# Patient Record
Sex: Female | Born: 1980 | Race: White | Hispanic: No | Marital: Married | Smoking: Never smoker
Health system: Southern US, Community
[De-identification: ages and names within clinical notes are randomized; demographics above are authoritative.]

---

## 2005-01-31 ENCOUNTER — Other Ambulatory Visit: Admission: RE | Admit: 2005-01-31 | Discharge: 2005-01-31 | Payer: Self-pay | Admitting: Obstetrics and Gynecology

## 2005-08-06 ENCOUNTER — Other Ambulatory Visit: Admission: RE | Admit: 2005-08-06 | Discharge: 2005-08-06 | Payer: Self-pay | Admitting: Obstetrics and Gynecology

## 2006-01-31 ENCOUNTER — Emergency Department (HOSPITAL_COMMUNITY): Admission: EM | Admit: 2006-01-31 | Discharge: 2006-01-31 | Payer: Self-pay | Admitting: Family Medicine

## 2007-03-28 ENCOUNTER — Inpatient Hospital Stay (HOSPITAL_COMMUNITY): Admission: AD | Admit: 2007-03-28 | Discharge: 2007-03-28 | Payer: Self-pay | Admitting: Obstetrics and Gynecology

## 2007-06-13 ENCOUNTER — Inpatient Hospital Stay (HOSPITAL_COMMUNITY): Admission: AD | Admit: 2007-06-13 | Discharge: 2007-06-13 | Payer: Self-pay | Admitting: Obstetrics and Gynecology

## 2007-06-13 ENCOUNTER — Ambulatory Visit: Payer: Self-pay | Admitting: Gynecology

## 2007-06-14 ENCOUNTER — Inpatient Hospital Stay (HOSPITAL_COMMUNITY): Admission: AD | Admit: 2007-06-14 | Discharge: 2007-06-15 | Payer: Self-pay | Admitting: Obstetrics and Gynecology

## 2007-06-19 ENCOUNTER — Ambulatory Visit (HOSPITAL_COMMUNITY): Admission: RE | Admit: 2007-06-19 | Discharge: 2007-06-19 | Payer: Self-pay | Admitting: Obstetrics and Gynecology

## 2007-06-25 ENCOUNTER — Ambulatory Visit: Payer: Self-pay | Admitting: Gynecology

## 2007-07-02 ENCOUNTER — Ambulatory Visit: Payer: Self-pay | Admitting: Obstetrics and Gynecology

## 2007-07-04 ENCOUNTER — Inpatient Hospital Stay (HOSPITAL_COMMUNITY): Admission: AD | Admit: 2007-07-04 | Discharge: 2007-07-07 | Payer: Self-pay | Admitting: Obstetrics and Gynecology

## 2007-07-04 ENCOUNTER — Encounter (INDEPENDENT_AMBULATORY_CARE_PROVIDER_SITE_OTHER): Payer: Self-pay | Admitting: Obstetrics and Gynecology

## 2007-07-08 ENCOUNTER — Encounter: Admission: RE | Admit: 2007-07-08 | Discharge: 2007-08-07 | Payer: Self-pay | Admitting: Obstetrics and Gynecology

## 2007-08-08 ENCOUNTER — Encounter: Admission: RE | Admit: 2007-08-08 | Discharge: 2007-09-06 | Payer: Self-pay | Admitting: Obstetrics and Gynecology

## 2007-09-07 ENCOUNTER — Encounter: Admission: RE | Admit: 2007-09-07 | Discharge: 2007-10-07 | Payer: Self-pay | Admitting: Obstetrics and Gynecology

## 2007-09-29 IMAGING — CR DG FOOT COMPLETE 3+V*R*
3 series · 3 of 3 positions shown · non-contrast
Comparison: None.

CLINICAL DATA: Twisted foot - lateral pain.
 RIGHT FOOT -  VIEW:

[view not recorded (1 of 3)]
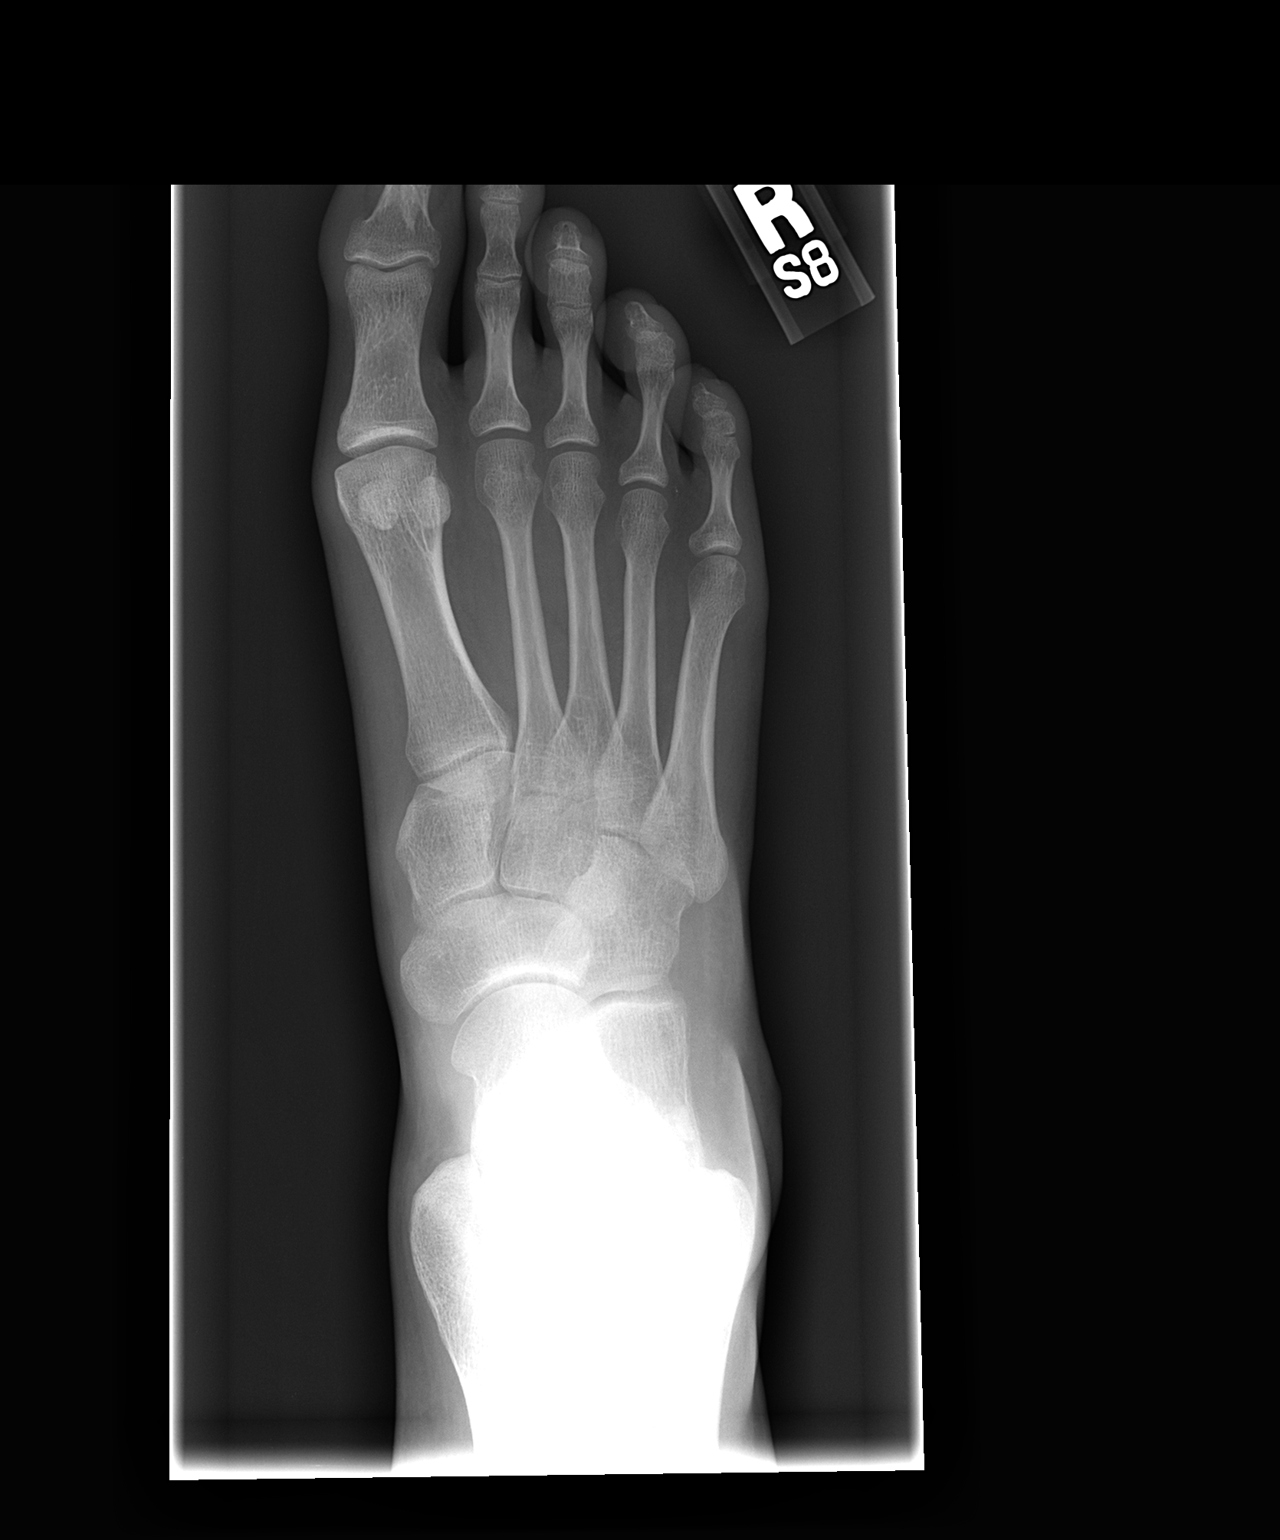

[view not recorded (2 of 3)]
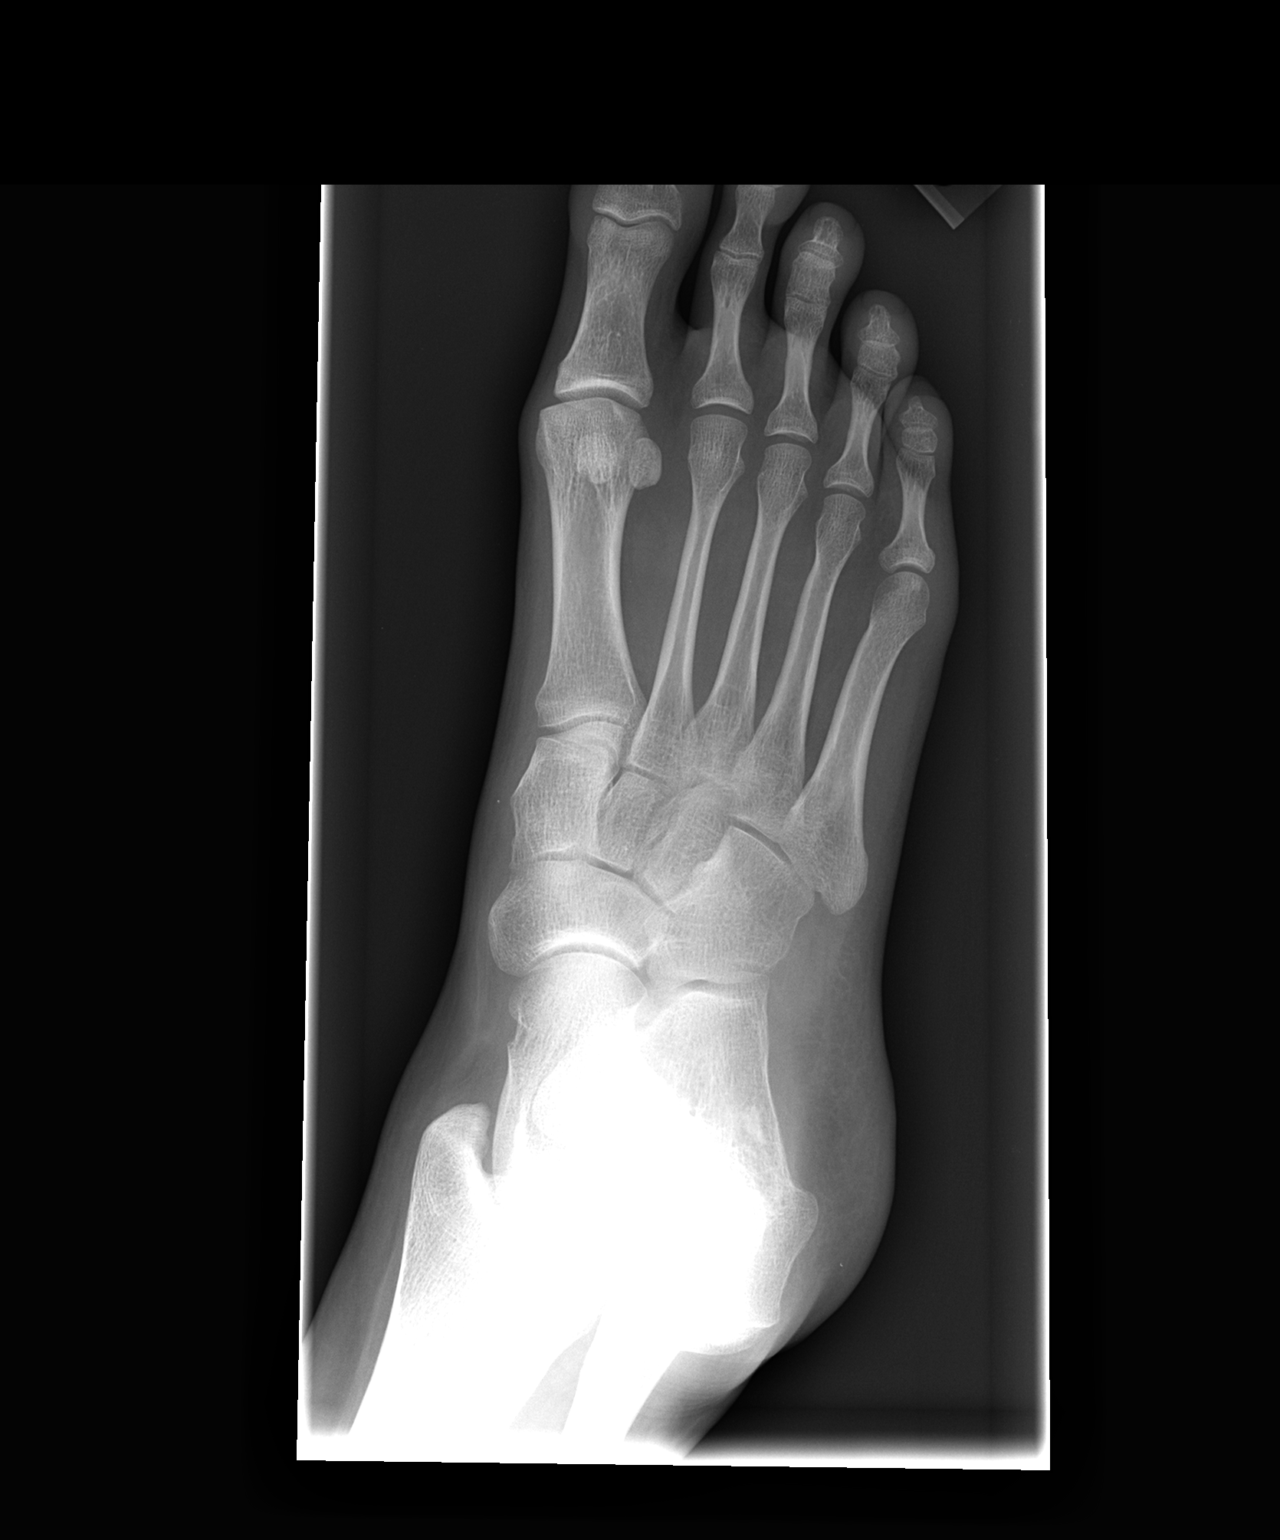

[view not recorded (3 of 3)]
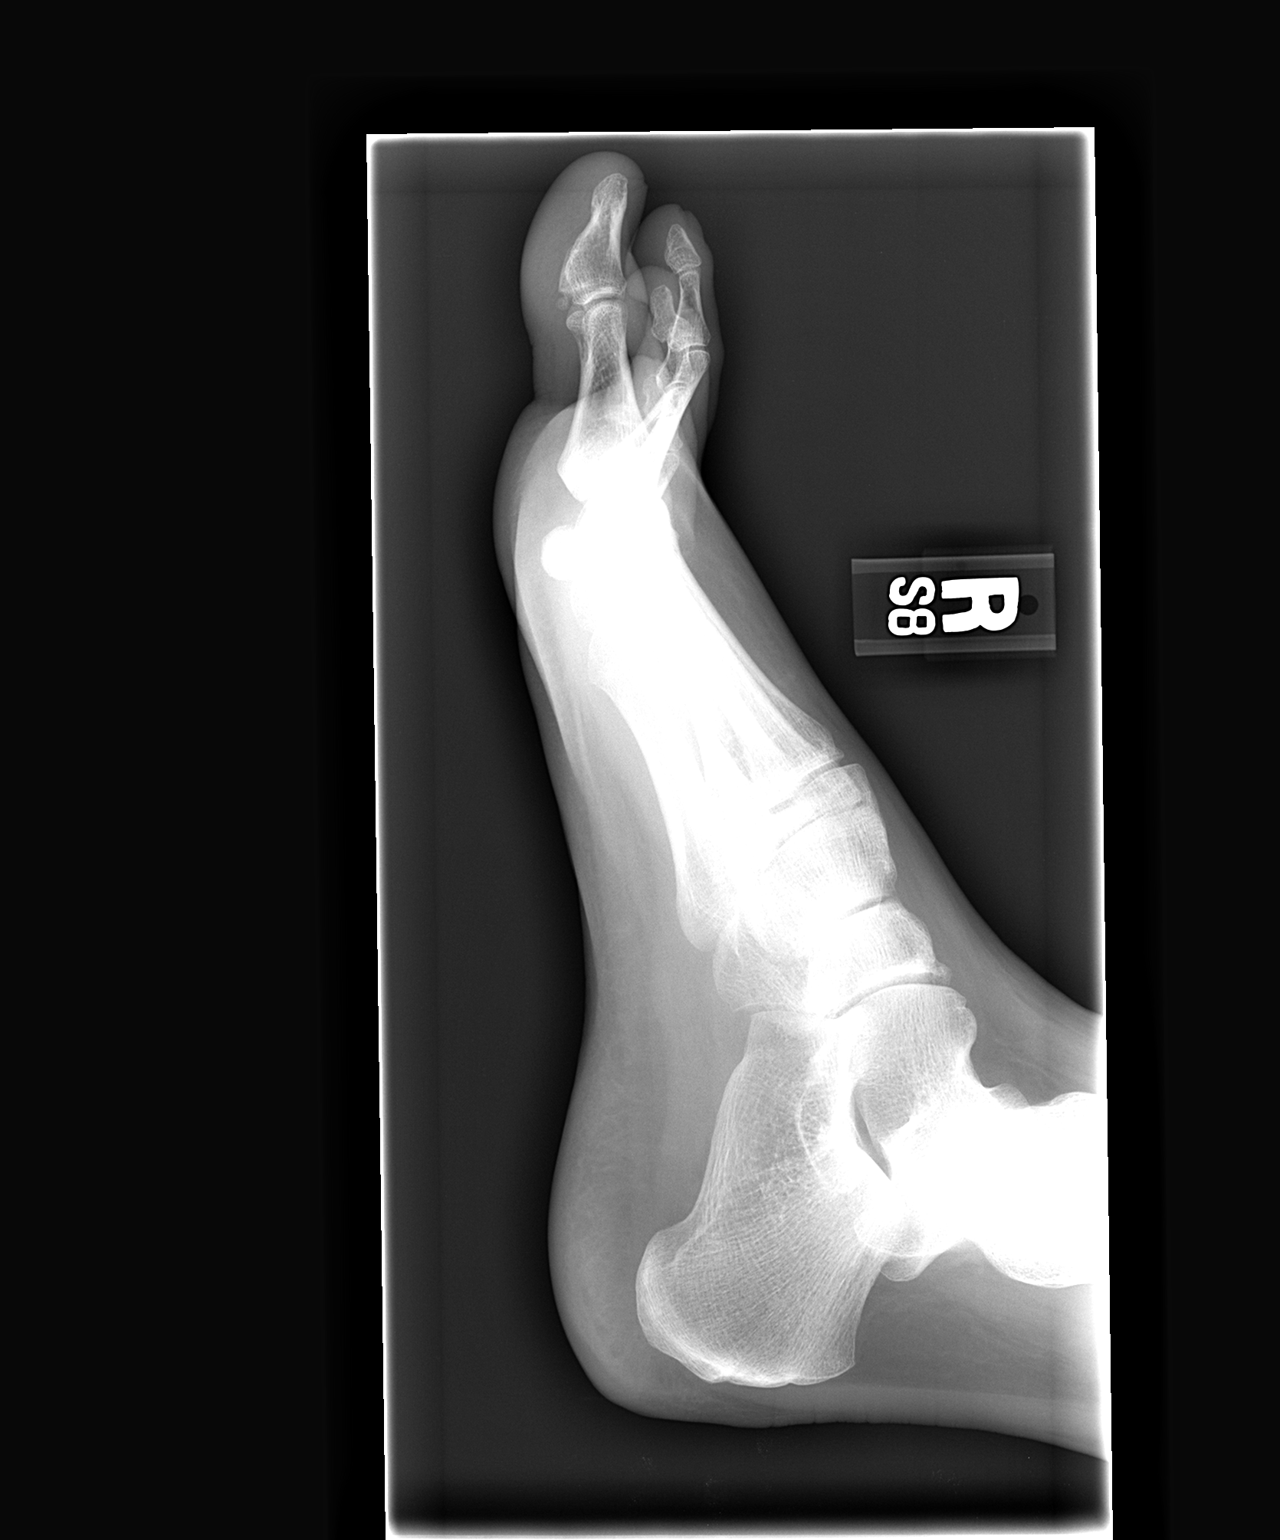

[3 of 3 positions shown; findings below may reference images not displayed]

There is no evidence of fracture or dislocation.  There is no evidence of arthropathy or other focal bone abnormality.  Soft tissues are unremarkable.
IMPRESSION: Negative.

## 2007-10-08 ENCOUNTER — Encounter: Admission: RE | Admit: 2007-10-08 | Discharge: 2007-11-06 | Payer: Self-pay | Admitting: Obstetrics and Gynecology

## 2007-11-07 ENCOUNTER — Encounter: Admission: RE | Admit: 2007-11-07 | Discharge: 2007-12-07 | Payer: Self-pay | Admitting: Obstetrics and Gynecology

## 2007-12-08 ENCOUNTER — Encounter: Admission: RE | Admit: 2007-12-08 | Discharge: 2008-01-07 | Payer: Self-pay | Admitting: Obstetrics and Gynecology

## 2008-01-08 ENCOUNTER — Encounter: Admission: RE | Admit: 2008-01-08 | Discharge: 2008-02-04 | Payer: Self-pay | Admitting: Obstetrics and Gynecology

## 2008-02-05 ENCOUNTER — Encounter: Admission: RE | Admit: 2008-02-05 | Discharge: 2008-03-06 | Payer: Self-pay | Admitting: Obstetrics and Gynecology

## 2008-03-07 ENCOUNTER — Encounter: Admission: RE | Admit: 2008-03-07 | Discharge: 2008-03-25 | Payer: Self-pay | Admitting: Obstetrics and Gynecology

## 2009-02-08 IMAGING — US US FETAL BPP W/O NONSTRESS
1 series · 14 of 28 positions shown · non-contrast
Comparison: none

OBSTETRICAL ULTRASOUND:

 This ultrasound exam was performed in the [HOSPITAL] Ultrasound Department.  The OB US report was generated in the AS system, and faxed to the ordering physician.  This report is also available in [REDACTED] PACS.

[Series 1: us fetal bpp w/o nonstress · 14 of 38 slices shown]
[im 2/38]
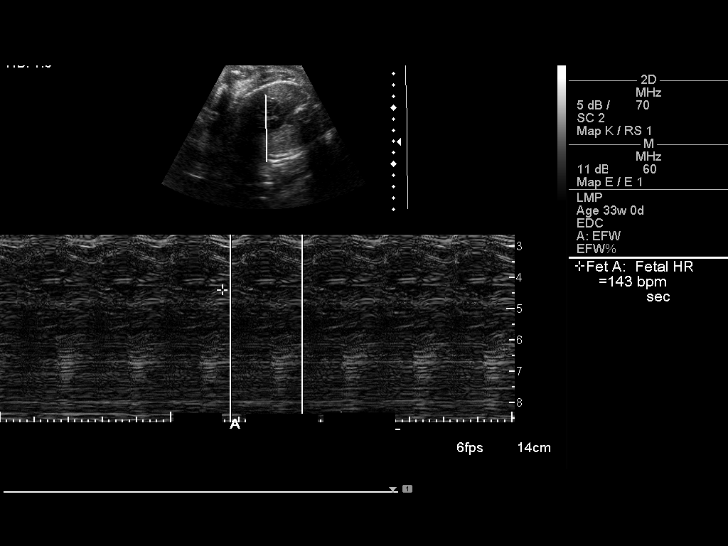
[im 5/38]
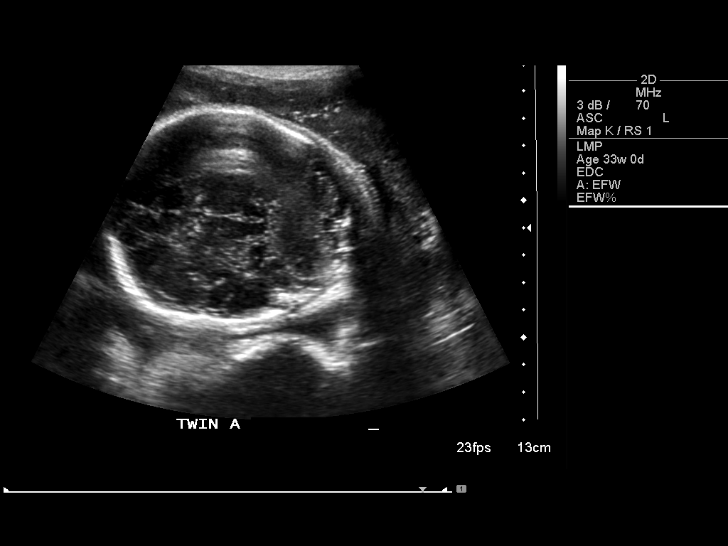
[im 7/38]
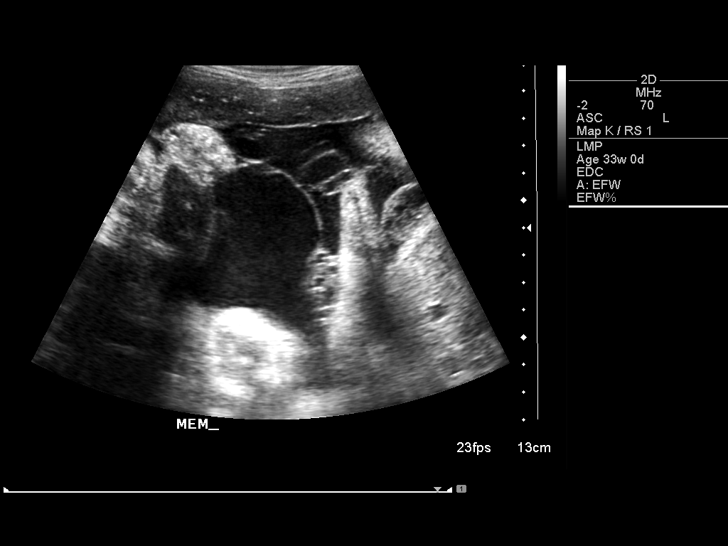
[im 10/38]
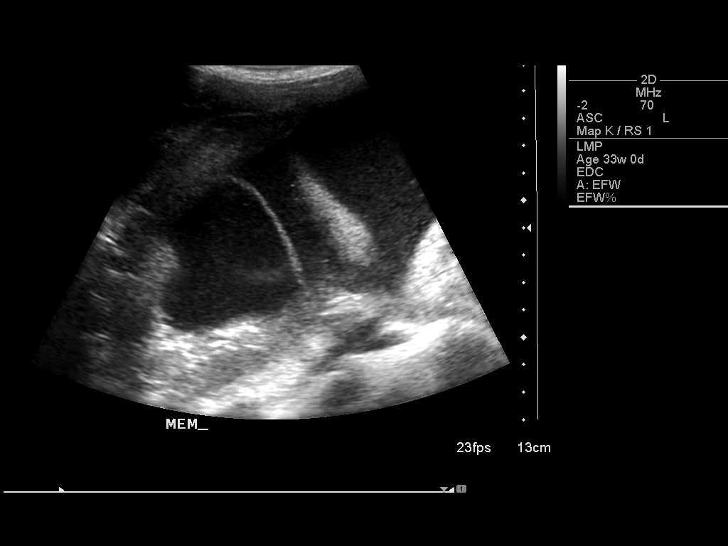
[im 13/38]
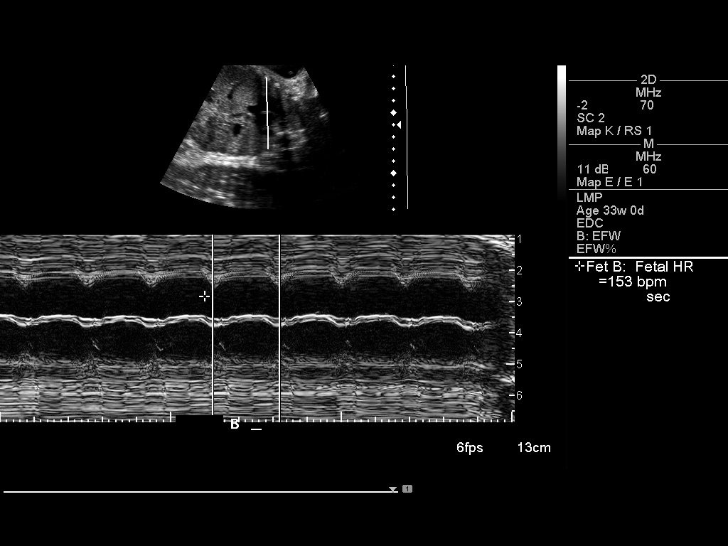
[im 16/38]
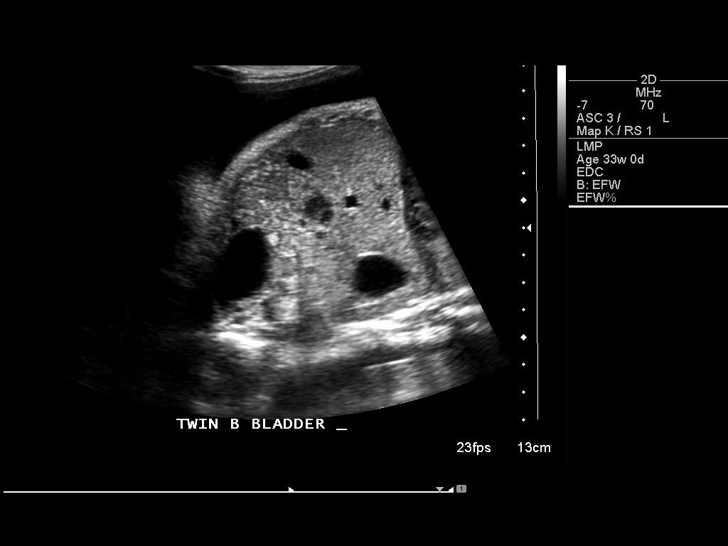
[im 18/38]
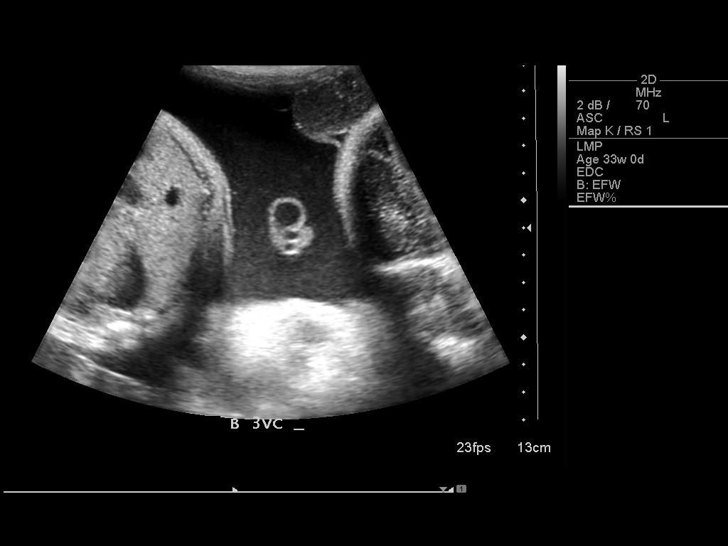
[im 21/38]
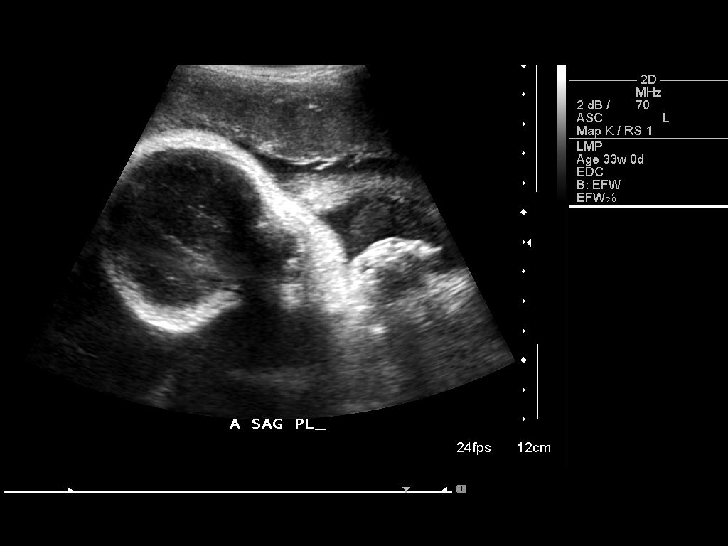
[im 24/38]
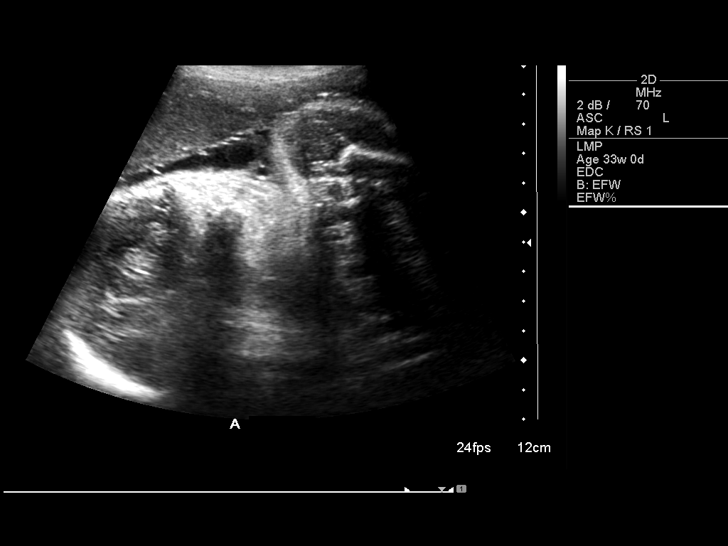
[im 27/38]
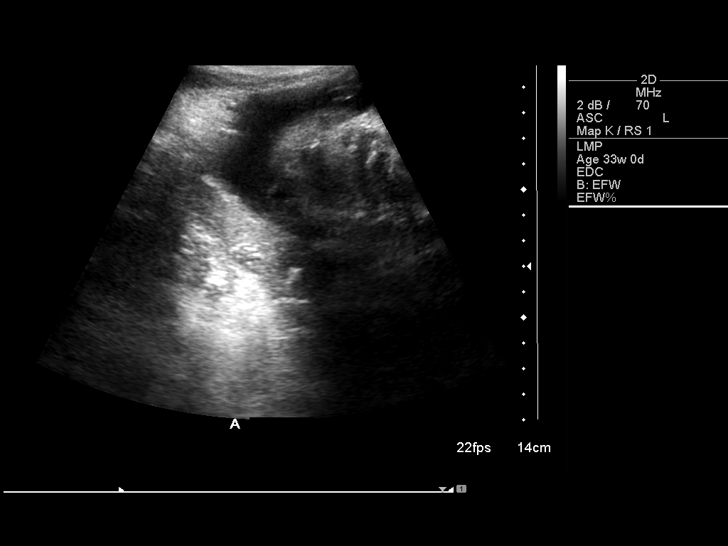
[im 29/38]
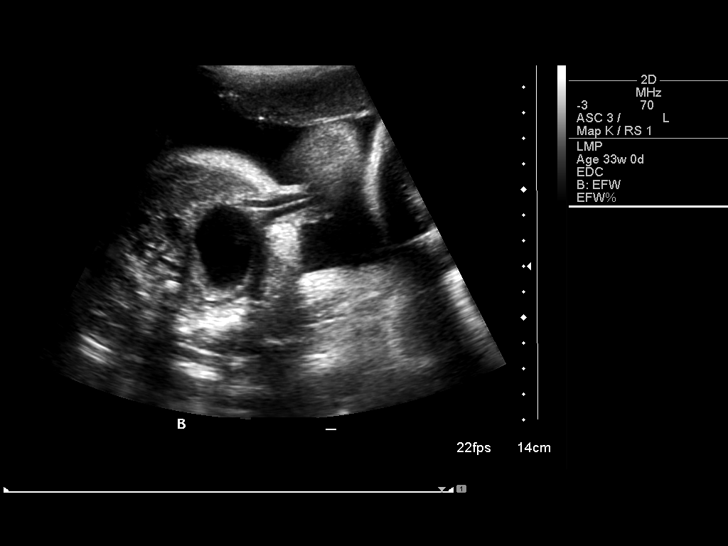
[im 32/38]
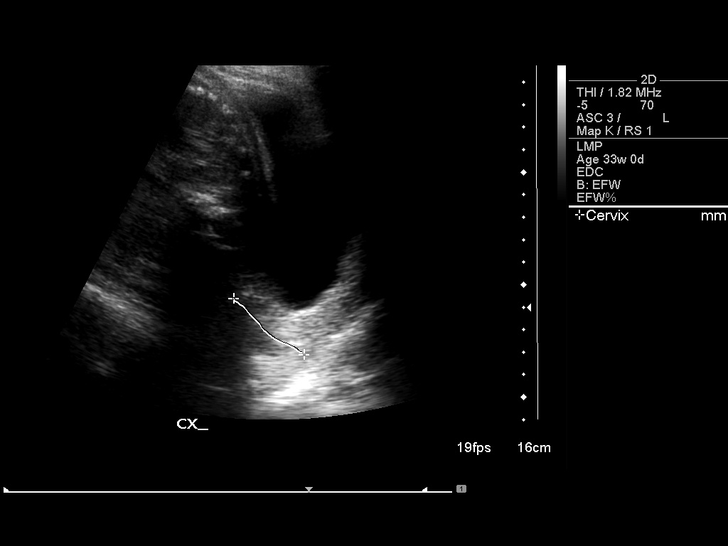
[im 35/38]
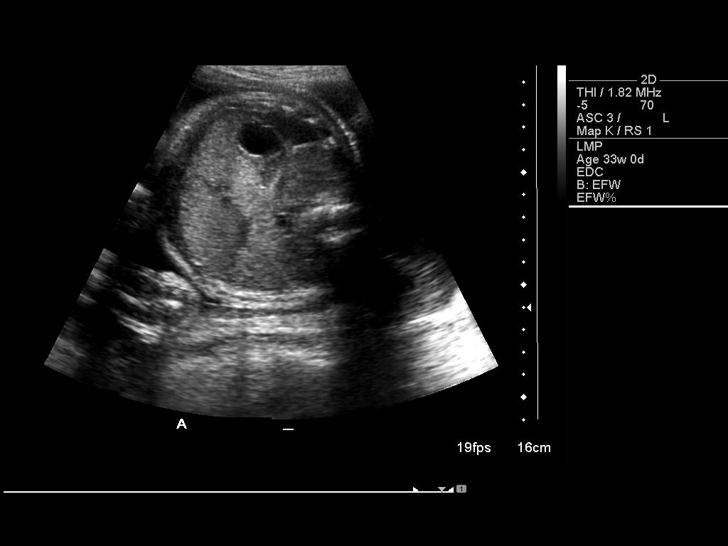
[im 38/38]
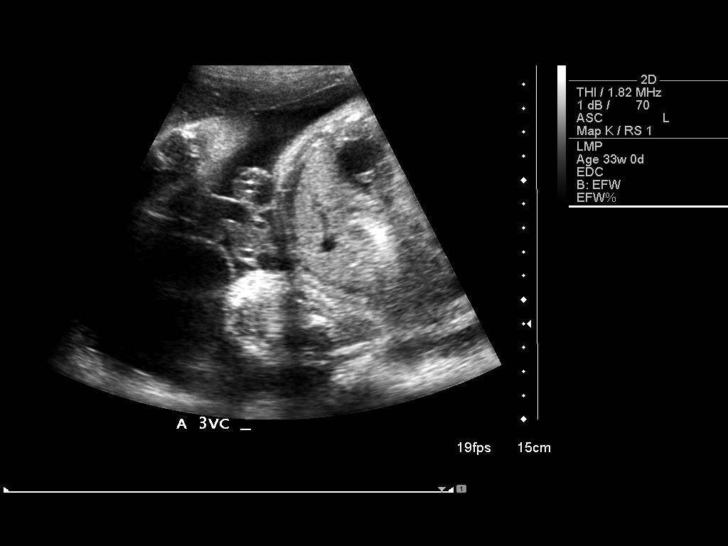

[14 of 28 positions shown; findings below may reference images not displayed]

IMPRESSION: See AS Obstetric US report.

## 2011-04-10 NOTE — H&P (Signed)
NAME:  Penny Larson, Penny Larson                ACCOUNT NO.:  [id number]   MEDICAL RECORD NO.:  [id number]          PATIENT TYPE:  INP   LOCATION:                                FACILITY:  [facility]   PHYSICIAN:  Dineen Kid. Rana Snare, M.D.    DATE OF BIRTH:  [birth year]   DATE OF ADMISSION:  DATE OF DISCHARGE:                              HISTORY & PHYSICAL   HISTORY OF PRESENT ILLNESS:  Ms. Sprankle is a 30 year old G1 P0 at [redacted] weeks  gestational age with twin pregnancy, breech transverse presentation  based on recent ultrasound.  Pregnancy has been complicated by pre-term  labor currently on bed rest and having recently discontinued terbutaline  she is now having regular contractions.  Cervix is 2 cm.  She presents  for primary cesarean section.  Her estimated date of confinement is  08/01/2007.  Her pregnancy was complicated only by elevated one hour  Glucola but normal 3 hour Glucola and pre-term labor.   PAST MEDICAL HISTORY:  Negative.   PAST SURGICAL HISTORY:  Negative.   MEDICATIONS:  Prenatal vitamins and terbutaline.   ALLERGIES:  No known drug allergies.   PHYSICAL EXAMINATION:  Her blood pressure is 104/50.  HEART:  Regular rate and rhythm.  LUNGS:  Clear to auscultation bilaterally.  ABDOMEN:  Gravid with twins.  Cervix is 2, 50% minus 3.  Presentation is  breech transverse.   IMPRESSION:  Twins at 36 weeks with labor, advanced dilation, breech  transverse presentation.  Plan primary low segment transverse cesarean  section.  Risks and benefits were discussed at length, informed consent  was obtained.      Dineen Kid Rana Snare, M.D.  Electronically Signed     DCL/MEDQ  D:  07/04/2007  T:  07/04/2007  Job:  [id number]

## 2011-04-10 NOTE — Discharge Summary (Signed)
NAME:  Penny Larson, Penny Larson                ACCOUNT NO.:  [id number]   MEDICAL RECORD NO.:  [id number]          PATIENT TYPE:  INP   LOCATION:  9124                          FACILITY:  [facility]   PHYSICIAN:  Guy Sandifer. Henderson Cloud, M.D. DATE OF BIRTH:  01-27-1981   DATE OF ADMISSION:  07/04/2007  DATE OF DISCHARGE:  07/07/2007                               DISCHARGE SUMMARY   ADMITTING DIAGNOSES:  1. Twin intrauterine gestation.  2. Thirty six weeks estimated gestational age.  3. Preterm labor.  4. Breech transverse presentation.   DISCHARGE DIAGNOSES:  1. Twin intrauterine gestation.  2. Thirty six weeks estimated gestational age.  3. Preterm labor.  4. Breech transverse presentation.   PROCEDURE:  On July 04, 2007, primary low-transverse cesarean section.   REASON FOR ADMISSION:  This patient is a 30 year old married female, G1,  P0, with an EDC of August 01, 2007, with a twin intrauterine  gestation.  She presents in preterm labor with breech transverse  presentation.   HOSPITAL COURSE:  The patient was admitted to the hospital, underwent  the above procedure.  Postoperatively, hemoglobin is 8.4.  Vital signs  remained stable.  She is afebrile.  She has good resumption of bowel and  bladder function.  She is ambulating well with good pain relief and  tolerating a regular diet.   CONDITION ON DISCHARGE:  Good.   DIET:  Regular as tolerated.   ACTIVITY:  No lifting, no operation of automobiles, no vaginal entry.   She is to call the office for problems including, but not limited to,  temperature of 101 degrees, increasing pain, persistent nausea,  vomiting, or heavy bleeding.   FOLLOWUP:  In the office in 2 weeks.   MEDICATIONS:  1. Percocet 5/325 mg, #40, one to two p.o. q.6 h. p.r.n.  2. Ibuprofen 600 mg q.6 h. p.r.n.  3. Tandem iron supplement, #30, one p.o. daily, one refill.  4. Prenatal vitamin daily.      Guy Sandifer Henderson Cloud, M.D.  Electronically Signed     JET/MEDQ   D:  07/07/2007  T:  07/07/2007  Job:  [id number]

## 2011-04-10 NOTE — Op Note (Signed)
NAME:  Penny Larson, Penny Larson                ACCOUNT NO.:  [id number]   MEDICAL RECORD NO.:  [id number]           PATIENT TYPE:   LOCATION:                                FACILITY:  [facility]   PHYSICIAN:  Dineen Kid. Rana Snare, M.D.    DATE OF BIRTH:  04/15/1981   DATE OF PROCEDURE:  07/04/2007  DATE OF DISCHARGE:                               OPERATIVE REPORT   PREOPERATIVE DIAGNOSIS:  Intrauterine pregnancy at 36 weeks, preterm  labor, and breech transverse presentation.   POSTOPERATIVE DIAGNOSIS:  Intrauterine pregnancy at 36 weeks, preterm  labor, and breech transverse presentation.   PROCEDURE:  Primary low segment transverse cesarean section.   SURGEON:  Dineen Kid. Rana Snare, M.D.   ANESTHESIA:  Spinal.   INDICATIONS:  Ms. Morrish is a 30 year old G1 at 39 weeks, previously  having problems with preterm labor on bedrest now presents in labor  after discontinuing her Terbutaline.  The baby is in a breech transverse  presentation.  Planned primary low segment transverse cesarean section.  The risks and benefits were discussed.  Informed consent was obtained.   FINDINGS AT SURGERY:  Viable females. Baby A with Apgars were 4 and 8,  pH arterial 7.30.  Baby B with Apgars 8 and 8 with a pH arterial 7.3.  Baby B weight is 5 pounds 10 ounces, baby A weight is currently pending.  There were no complications.   DESCRIPTION OF PROCEDURE:  After adequate analgesia, the patient was  placed in the supine position with left lateral tilt.  She is sterilely  prepped and draped.  The bladder is sterilely drained with a Foley  catheter.  A Pfannenstiel skin incision was made two fingerbreadths  above the pubic symphysis and taken down sharply to the fascia which was  incised transversely and extended superiorly and inferiorly over the  bellies of the rectus muscle which were separated sharply in the  midline.  The peritoneum was entered sharply.  The bladder flap was  created and placed behind the bladder blade.  A low  segment myotomy  incision was made down to the amniotic sac. Baby A buttocks were easily  delivered, the arms reduced.  The nares and pharynx were suctioned.  The  cord was clamped and cut and the baby was handed to the pediatrician for  resuscitation.  Cord blood was obtained. Baby B was manually everted  from transverse to vertex and delivered with clear fluid noted.  The  nares and pharynx were suctioned, the baby was then delivered, cord  clamped, cut and handed to the pediatrician for resuscitation.  Cord  blood was obtained.  The placentae were extracted manually.   The uterus was exteriorized and wiped clean with a dry lap.  The myotomy  incision was closed in two layers, first being a running locking layer,  the second being an imbricating layer of 0 Monocryl suture.  The uterus  was placed back into the peritoneal cavity and after a copious amount of  irrigation, adequate hemostasis was achieved, the peritoneum was closed  with 0 Monocryl.  The rectus muscle was  plicated in the midline,  irrigation was applied and after adequate hemostasis, the fascia was  closed with #1 Vicryl in a running fashion.  Irrigation was applied and  after adequate hemostasis, Scarpa's fascia was reapproximated with 2-0  plain suture.  The skin was then stapled and  Steri-Strips applied.  The patient tolerated the procedure well, was  stable on transfer to the recovery room.  Sponge and instrument counts  were correct x3.  Estimated blood loss 700 mL.  The patient received 1  gram Rocephin after delivery of the placenta.      Dineen Kid Rana Snare, M.D.  Electronically Signed     DCL/MEDQ  D:  07/04/2007  T:  07/05/2007  Job:  [id number]

## 2011-09-10 LAB — CBC
HCT: 25.6 — ABNORMAL LOW
Hemoglobin: 9 — ABNORMAL LOW
MCHC: 34.3
MCHC: 34.5
MCV: 97.3
Platelets: 207
Platelets: 305
RBC: 3.3 — ABNORMAL LOW
RBC: 2.64 — ABNORMAL LOW
RDW: 15.3 — ABNORMAL HIGH
WBC: 9.2
WBC: 9.3

## 2011-09-10 LAB — RAPID HIV SCREEN (WH-MAU): Rapid HIV Screen: NONREACTIVE

## 2014-06-21 ENCOUNTER — Emergency Department (HOSPITAL_COMMUNITY)
Admission: EM | Admit: 2014-06-21 | Discharge: 2014-06-21 | Disposition: A | Discharge status: Home / Self Care | Payer: 59 | Source: Home / Self Care | Attending: Family Medicine | Admitting: Family Medicine

## 2014-06-21 ENCOUNTER — Encounter (HOSPITAL_COMMUNITY): Payer: Self-pay | Admitting: Emergency Medicine

## 2014-06-21 DIAGNOSIS — L819 Disorder of pigmentation, unspecified: Secondary | ICD-10-CM

## 2014-06-21 NOTE — Discharge Instructions (Signed)
See dermatologist as discussed.

## 2014-06-21 NOTE — ED Provider Notes (Signed)
CSN: [id number]     Arrival date & time 06/21/14  1314 History   First MD Initiated Contact with Patient 06/21/14 1358     Chief Complaint  Patient presents with  . Skin Problem   (Consider location/radiation/quality/duration/timing/severity/associated sxs/prior Treatment) Patient is a 33 y.o. female presenting with rash. The history is provided by the patient.  Rash Location:  Face Facial rash location:  Forehead Quality: not itchy, not peeling and not red   Quality comment:  Irreg hyperpigmentation Severity:  Mild Onset quality:  Gradual Progression:  Spreading Chronicity:  New Relieved by:  None tried Worsened by:  Nothing tried Ineffective treatments:  None tried   History reviewed. No pertinent past medical history. History reviewed. No pertinent past surgical history. History reviewed. No pertinent family history. History  Substance Use Topics  . Smoking status: Never Smoker   . Smokeless tobacco: Not on file  . Alcohol Use: No   OB History   Grav Para Term Preterm Abortions TAB SAB Ect Mult Living                 Review of Systems  Constitutional: Negative.   Skin: Positive for rash.    Allergies  Review of patient's allergies indicates no known allergies.  Home Medications   Prior to Admission medications   Medication Sig Start Date End Date Taking? Authorizing Provider  Norethin Ace-Eth Estrad-FE (LOMEDIA 24 FE PO) Take by mouth.   Yes Historical Provider, MD   BP 102/69  Pulse 74  Temp(Src) 98.1 F (36.7 C) (Oral)  Resp 16  Ht 5\' 8"  (1.727 m)  Wt 130 lb (58.968 kg)  BMI 19.77 kg/m2  SpO2 100%  LMP 06/19/2014 Physical Exam  Nursing note and vitals reviewed. Constitutional: She is oriented to person, place, and time. She appears well-developed and well-nourished.  Neurological: She is alert and oriented to person, place, and time.  Skin: Skin is warm and dry. Rash noted.       ED Course  Procedures (including critical care time) Labs  Review Labs Reviewed - No data to display  Imaging Review No results found.   MDM   1. Hyperpigmentation of skin        Linna HoffJames D Kindl, MD 06/21/14 1414

## 2014-06-21 NOTE — ED Notes (Signed)
PT  HAS  AN AREA  OF  DARK  PIGMENTATION    ON  FOREHEAD        THAT  SHE REPORTS  HAS  BEEN THERE  FOR  ABOUT  1  YEAR  BUT  SHE  STATES  HAS  BECOME  MORE  NOTICEABLE     SINCE  6  WEEKS     NO  NEW  MEDS  OR  KNOWN  CASATIVE  AGENT

## 2015-01-17 ENCOUNTER — Other Ambulatory Visit: Payer: Self-pay | Admitting: Obstetrics and Gynecology

## 2015-01-18 LAB — CYTOLOGY - PAP

## 2015-06-12 ENCOUNTER — Emergency Department (HOSPITAL_COMMUNITY)
Admission: EM | Admit: 2015-06-12 | Discharge: 2015-06-12 | Disposition: A | Discharge status: Home / Self Care | Payer: 59 | Source: Home / Self Care | Attending: Family Medicine | Admitting: Family Medicine

## 2015-06-12 ENCOUNTER — Encounter (HOSPITAL_COMMUNITY): Payer: Self-pay | Admitting: Emergency Medicine

## 2015-06-12 DIAGNOSIS — S80861A Insect bite (nonvenomous), right lower leg, initial encounter: Secondary | ICD-10-CM | POA: Diagnosis not present

## 2015-06-12 DIAGNOSIS — W57XXXA Bitten or stung by nonvenomous insect and other nonvenomous arthropods, initial encounter: Secondary | ICD-10-CM | POA: Diagnosis not present

## 2015-06-12 MED ORDER — FLUTICASONE PROPIONATE 0.05 % EX CREA: TOPICAL_CREAM | Freq: Two times a day (BID) | CUTANEOUS | Status: DC

## 2015-06-12 NOTE — ED Notes (Signed)
Noticed a swollen, tender area to the calf of right leg last night, reports this has increased in size since onset

## 2015-06-12 NOTE — Discharge Instructions (Signed)
Heat, cream twice a day, aspirin 1 a day, return if needed.

## 2015-06-12 NOTE — ED Provider Notes (Signed)
CSN: [id number]     Arrival date & time 06/12/15  1504 History   First MD Initiated Contact with Patient 06/12/15 1618     Chief Complaint  Patient presents with  . Leg Pain   (Consider location/radiation/quality/duration/timing/severity/associated sxs/prior Treatment) Patient is a 34 y.o. female presenting with leg pain. The history is provided by the patient.  Leg Pain Location:  Leg Leg location:  R lower leg Pain details:    Quality:  Dull (sore only to touch, otherwise nontender.)   Radiates to:  Does not radiate   Severity:  Mild   Onset quality:  Sudden   Duration:  2 days   Progression:  Unchanged Chronicity:  [facility] Dislocation: no   Ineffective treatments:  Elevation Associated symptoms: no decreased ROM, no fever, no numbness, no stiffness and no swelling   Risk factors comment:  Has varicose vein [email or web address].   History reviewed. No pertinent past medical history. History reviewed. No pertinent past surgical history. No family history on file. History  Substance Use Topics  . Smoking status: Never Smoker   . Smokeless tobacco: Not on file  . Alcohol Use: No   OB History    No data available     Review of Systems  Constitutional: Negative.  Negative for fever.  Musculoskeletal: Negative for gait problem and stiffness.  Skin: Positive for rash.    Allergies  Review of patient's allergies indicates no known allergies.  Home Medications   Prior to Admission medications   Medication Sig Start Date End Date Taking? Authorizing Provider  fluticasone (CUTIVATE) 0.05 % cream Apply topically 2 (two) times daily. 06/12/15   Linna HoffJames D Kindl, MD  Norethin Ace-Eth Estrad-FE (LOMEDIA 24 FE PO) Take by mouth.    Historical Provider, MD   BP 120/84 mmHg  Pulse 88  Temp(Src) 99.6 F (37.6 C) (Oral)  Resp 18  SpO2 99%  LMP 04/23/2015 Physical Exam  Constitutional: She is oriented to person, place, and time. She appears well-developed  and well-nourished. No distress.  Musculoskeletal: She exhibits tenderness.       Legs: Neurological: She is alert and oriented to person, place, and time.  Skin: Skin is warm and dry. No erythema.  Nursing note and vitals reviewed.   ED Course  Procedures (including critical care time) Labs Review Labs Reviewed - No data to display  Imaging Review No results found.   MDM   1. Insect bite of leg, right, initial encounter   feel likely insect bite v. Superficial phlebitis given varicose vein issues, no sign of deep problem.Pt appears in agreement.    Linna HoffJames D Kindl, MD 06/12/15 [phone]

## 2015-12-29 MED FILL — LARIN 24 FE 1 MG-20 MCG TAB: 1-20 | 84 days supply | Qty: 84 | Fill #0

## 2016-01-19 DIAGNOSIS — Z01419 Encounter for gynecological examination (general) (routine) without abnormal findings: Secondary | ICD-10-CM | POA: Diagnosis not present

## 2016-01-19 DIAGNOSIS — Z6821 Body mass index (BMI) 21.0-21.9, adult: Secondary | ICD-10-CM | POA: Diagnosis not present

## 2016-03-26 MED FILL — LARIN 24 FE 1 MG-20 MCG TAB: 1-20 | 84 days supply | Qty: 84 | Fill #0

## 2016-06-13 MED FILL — LARIN 24 FE 1 MG-20 MCG TAB: 1-20 | 84 days supply | Qty: 84 | Fill #1

## 2016-07-10 MED FILL — TRETINOIN 0.05% CREAM: 0.05 | 30 days supply | Qty: 20 | Fill #1

## 2016-09-10 MED FILL — LARIN 24 FE 1 MG-20 MCG TAB: 1-20 | 84 days supply | Qty: 84 | Fill #2

## 2016-12-03 MED FILL — LARIN 24 FE 1 MG-20 MCG TAB: 1-20 | 84 days supply | Qty: 84 | Fill #3

## 2017-01-21 DIAGNOSIS — Z6820 Body mass index (BMI) 20.0-20.9, adult: Secondary | ICD-10-CM | POA: Diagnosis not present

## 2017-01-21 DIAGNOSIS — Z01419 Encounter for gynecological examination (general) (routine) without abnormal findings: Secondary | ICD-10-CM | POA: Diagnosis not present

## 2017-03-21 MED FILL — MIBELAS 24 FE CHEWABLE TAB: 1-20 | 84 days supply | Qty: 84 | Fill #0 | Status: TO

## 2017-06-13 MED FILL — NORETH-ESTRAD-FE 1-0.02(24): 1-20 | 84 days supply | Qty: 84 | Fill #0

## 2017-09-04 MED FILL — MELODETTA 24 FE CHEWABLE TA: 1-20 | 84 days supply | Qty: 84 | Fill #0

## 2017-12-02 MED FILL — MELODETTA 24 FE CHEWABLE TA: 1-20 | 84 days supply | Qty: 84 | Fill #1

## 2018-02-19 DIAGNOSIS — Z6821 Body mass index (BMI) 21.0-21.9, adult: Secondary | ICD-10-CM | POA: Diagnosis not present

## 2018-02-19 DIAGNOSIS — Z01419 Encounter for gynecological examination (general) (routine) without abnormal findings: Secondary | ICD-10-CM | POA: Diagnosis not present

## 2018-02-19 DIAGNOSIS — N760 Acute vaginitis: Secondary | ICD-10-CM | POA: Diagnosis not present

## 2018-02-19 MED FILL — FLUCONAZOLE 150 MG TABLET: 150 | 4 days supply | Qty: 2 | Fill #0

## 2018-02-19 MED FILL — MELODETTA 24 FE CHEWABLE TA: 1-20 | 84 days supply | Qty: 84 | Fill #0

## 2018-02-21 MED FILL — VIT D2 1.25 MG (50,000 UNIT: 1.25 MG | 56 days supply | Qty: 8 | Fill #0

## 2018-05-08 DIAGNOSIS — E559 Vitamin D deficiency, unspecified: Secondary | ICD-10-CM | POA: Diagnosis not present

## 2018-05-13 MED FILL — MELODETTA 24 FE CHEWABLE TA: 1-20 | 84 days supply | Qty: 84 | Fill #1

## 2018-05-23 ENCOUNTER — Encounter: Payer: Self-pay | Admitting: Endocrinology

## 2018-07-20 NOTE — Progress Notes (Addendum)
Patient ID: Penny Larson, female   DOB: 1981-10-29, 37 y.o.   MRN: [id number]                                                                                                              Reason for Appointment: Abnormal thyroid tests, new consultation  Referring practice: [facility]   Chief complaint: Evaluation of thyroid   History of Present Illness:   She was apparently having routine thyroid labs done annual exam in 3/19 by her gynecologist and she was found to have a low TSH  She denies any symptoms of palpitations, shakiness, feeling excessively warm and sweaty, nervousness, and new fatigue. She has gradually been gaining weight over the last few years and in the last 6 months has gained about 10 pounds  She did not have any history of thyroid disease in the past She has not had any follow-up labs done since 01/2018 She said that she feels well although tends to stay tired which is not new  Thyroid levels done on 02/19/2018 showed TSH of 0.01, free T3 of 3.1 and free T4 of 1.1 (normal 0.8-1.8)  Wt Readings from Last 3 Encounters:  07/21/18 152 lb (68.9 kg)  06/21/14 130 lb (59 kg)     Thyroid function tests as above:     No results found for: THYROTRECAB   Allergies as of 07/21/2018   No Known Allergies     Medication List        Accurate as of 07/21/18  8:38 PM. Always use your most recent med list.          fluticasone 0.05 % cream Commonly known as:  CUTIVATE Apply topically 2 (two) times daily.   LOMEDIA 24 FE PO Take by mouth.           History reviewed. No pertinent past medical history.  No past surgical history on file.  Family History  Problem Relation Age of Onset  . Diabetes Mother   . Thyroid disease Neg Hx     Social History:  reports that she has never smoked. She has never used smokeless tobacco. She reports that she does not drink alcohol or use drugs.  Allergies: No Known Allergies   Review of  Systems  Constitutional: Positive for weight gain.       She has been gaining weight and she thinks she has not made good choices with her diet  Respiratory: Negative for daytime sleepiness.   Cardiovascular: Negative for palpitations.  Gastrointestinal: Negative for constipation and diarrhea.  Endocrine: Negative for menstrual changes and fatigue.  Genitourinary: Negative for frequency.  Musculoskeletal: Negative for joint pain.  Skin: Negative for rash.  Neurological: Negative for weakness.       Examination:   BP 118/74 (BP Location: Right Arm, Patient Position: Sitting, Cuff Size: Normal)   Pulse 95   Ht 5\' 8"  (1.727 m)   Wt 152 lb (68.9 kg)   SpO2 98%   BMI 23.11 kg/m    General Appearance:  well-built and nourished, pleasant, mildly anxious but not hyperkinetic.        Eyes: No abnormal prominence, lid lag or stare present.  No swelling of the eyelids   Neck: The thyroid is nonpalpable  There is no lymphadenopathy in the neck .           Heart: normal S1 and S2, no murmurs .          Lungs: breath sounds are normal bilaterally without added sounds  Abdomen: no hepatosplenomegaly or other palpable abnormality  Extremities: hands are warm. No ankle edema.  Neurological:    No tremors are present. 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 Any transcriptional errors that result from this process are unintentional.  ADDENDUM: Thyroid levels as follows, TSH not in hyperthyroid range Since she is asymptomatic and free T4 low normal will need to recheck in 3 months to rule out possible secondary hypothyroidism  Lab Results  Component Value Date   TSH 0.23 (L) 07/21/2018   FREET4 0.62 07/21/2018   Free T3 = 3.4  Reather LittlerAjay Phynix Horton

## 2018-07-21 ENCOUNTER — Encounter: Payer: Self-pay | Admitting: Endocrinology

## 2018-07-21 ENCOUNTER — Ambulatory Visit: Payer: 59 | Admitting: Endocrinology

## 2018-07-21 VITALS — BP 118/74 | HR 95 | Ht 68.0 in | Wt 152.0 lb

## 2018-07-21 DIAGNOSIS — R635 Abnormal weight gain: Secondary | ICD-10-CM

## 2018-07-21 DIAGNOSIS — R7989 Other specified abnormal findings of blood chemistry: Secondary | ICD-10-CM | POA: Diagnosis not present

## 2018-07-21 LAB — TSH: TSH: 0.23 u[IU]/mL — AB (ref 0.35–4.50)

## 2018-07-21 LAB — T3, FREE: T3, Free: 3.4 pg/mL (ref 2.3–4.2)

## 2018-07-21 LAB — T4, FREE: FREE T4: 0.62 ng/dL (ref 0.60–1.60)

## 2018-07-21 NOTE — Addendum Note (Signed)
Addended by: Reather LittlerKUMAR, Octavis Sheeler on: 07/21/2018 08:41 PM   Modules accepted: Orders

## 2018-08-07 MED FILL — MIBELAS 24 FE CHEWABLE TAB: 1-20 | 84 days supply | Qty: 84 | Fill #2

## 2018-10-14 ENCOUNTER — Other Ambulatory Visit (INDEPENDENT_AMBULATORY_CARE_PROVIDER_SITE_OTHER): Payer: 59

## 2018-10-14 DIAGNOSIS — R7989 Other specified abnormal findings of blood chemistry: Secondary | ICD-10-CM | POA: Diagnosis not present

## 2018-10-14 LAB — T4, FREE: FREE T4: 0.66 ng/dL (ref 0.60–1.60)

## 2018-10-14 LAB — T3, FREE: T3, Free: 2.7 pg/mL (ref 2.3–4.2)

## 2018-10-14 LAB — TSH: TSH: 0.97 u[IU]/mL (ref 0.35–4.50)

## 2018-10-16 NOTE — Progress Notes (Signed)
Patient ID: Penny Larson, female   DOB: 1981/09/02, 37 y.o.   MRN: [id number]                                                                                                               Referring practice: [facility]   Chief complaint: Follow-up of thyroid   History of Present Illness:   Background history: On her annual exam in 3/19 by her gynecologist she was found to have a low TSH, probably done on routine screening At that time she had no symptoms suggestive of hyperthyroidism and her free T3 and free T4 were normal as below Also had no swelling or tenderness in her thyroid area and no goiter on exam  Recent history:  On her initial evaluation in 8/19 her thyroid levels had improved and she was asymptomatic  Recently again she feels well although tends to stay tired which is not new and may be related to her busy lifestyle She tends to have mild cold intolerance which is not new No dry skin or hair loss Her weight gain appears to have leveled off  Baseline thyroid levels done on 02/19/2018 showed TSH of 0.01, free T3 of 3.1 and free T4 of 1.1 (normal 0.8-1.8)  Wt Readings from Last 3 Encounters:  10/17/18 148 lb (67.1 kg)  07/21/18 152 lb (68.9 kg)  06/21/14 130 lb (59 kg)     Thyroid function tests   Lab Results  Component Value Date   TSH 0.97 10/14/2018   TSH 0.23 (L) 07/21/2018   FREET4 0.66 10/14/2018   FREET4 0.62 07/21/2018     Lab Results  Component Value Date   T3FREE 2.7 10/14/2018   T3FREE 3.4 07/21/2018     No results found for: THYROTRECAB   Allergies as of 10/17/2018   No Known Allergies     Medication List        Accurate as of 10/17/18  8:27 AM. Always use your most recent med list.          LOMEDIA 24 FE PO Take by mouth.           No past medical history on file.  No past surgical history on file.  Family History  Problem Relation Age of Onset  . Diabetes Mother   . Thyroid disease Neg  Hx     Social History:  reports that she has never smoked. She has never used smokeless tobacco. She reports that she does not drink alcohol or use drugs.  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  She continues  to be asymptomatic Her exam is unremarkable  Thyroid functions have shown her TSH going back to normal now However free T4 has been low normal only slightly improved  Unclear whether she may have mild secondary hypothyroidism but is asymptomatic at time  Recommend that we have her thyroid levels checked again when she has her annual exam in March and if they are improved no further evaluation needed She will call if she has any unusual fatigue  Reather Littlerjay Antrice Pal 10/17/2018, 8:27 AM    Note: This office note was prepared with Dragon voice recognition system technology. Any transcriptional errors that result from this process are unintentional.

## 2018-10-17 ENCOUNTER — Encounter: Payer: Self-pay | Admitting: Endocrinology

## 2018-10-17 ENCOUNTER — Ambulatory Visit: Payer: 59 | Admitting: Endocrinology

## 2018-10-17 VITALS — BP 108/62 | HR 64 | Ht 68.0 in | Wt 148.0 lb

## 2018-10-17 DIAGNOSIS — R7989 Other specified abnormal findings of blood chemistry: Secondary | ICD-10-CM

## 2018-10-31 MED FILL — MIBELAS 24 FE CHEWABLE TAB: 1-20 | 84 days supply | Qty: 84 | Fill #3

## 2019-01-26 MED FILL — MIBELAS 24 FE CHEWABLE TAB: 1-20 | 84 days supply | Qty: 84 | Fill #4

## 2019-04-07 DIAGNOSIS — Z6828 Body mass index (BMI) 28.0-28.9, adult: Secondary | ICD-10-CM | POA: Diagnosis not present

## 2019-04-07 DIAGNOSIS — Z01419 Encounter for gynecological examination (general) (routine) without abnormal findings: Secondary | ICD-10-CM | POA: Diagnosis not present

## 2019-04-16 MED FILL — NORETHIN ACE-ETH ESTRAD-FE: 1-20 | 84 days supply | Qty: 84 | Fill #0

## 2019-07-10 MED FILL — NORETHIN ACE-ETH ESTRAD-FE: 1-20 | 84 days supply | Qty: 84 | Fill #1

## 2019-10-05 MED FILL — NORETHIN ACE-ETH ESTRAD-FE: 1-20 | 84 days supply | Qty: 84 | Fill #2

## 2019-12-24 MED FILL — FLUCONAZOLE 150 MG TABS: 150 | 1 days supply | Qty: 1 | Fill #0

## 2019-12-25 MED FILL — NORETHIN ACE-ETH ESTRAD-FE: 1-20 | 84 days supply | Qty: 84 | Fill #3

## 2020-03-21 MED FILL — MELODETTA 24 FE CHEWABLE TA: 1-20 | 84 days supply | Qty: 84 | Fill #4

## 2020-04-07 ENCOUNTER — Other Ambulatory Visit (HOSPITAL_COMMUNITY): Payer: Self-pay | Admitting: Radiology

## 2020-04-07 DIAGNOSIS — N760 Acute vaginitis: Secondary | ICD-10-CM | POA: Diagnosis not present

## 2020-04-07 DIAGNOSIS — Z01419 Encounter for gynecological examination (general) (routine) without abnormal findings: Secondary | ICD-10-CM | POA: Diagnosis not present

## 2020-04-07 DIAGNOSIS — Z6822 Body mass index (BMI) 22.0-22.9, adult: Secondary | ICD-10-CM | POA: Diagnosis not present

## 2020-04-08 DIAGNOSIS — Z01419 Encounter for gynecological examination (general) (routine) without abnormal findings: Secondary | ICD-10-CM | POA: Diagnosis not present

## 2020-06-13 MED FILL — NORETHIN ACE-ETH ESTRAD-FE: 1-20 | 84 days supply | Qty: 84 | Fill #0

## 2020-09-14 MED FILL — NORETHIN ACE-ETH ESTRAD-FE: 1-20 | 84 days supply | Qty: 84 | Fill #1

## 2020-12-09 MED FILL — NORETHIN ACE-ETH ESTRAD-FE: 1-20 | 84 days supply | Qty: 84 | Fill #2

## 2021-02-28 ENCOUNTER — Other Ambulatory Visit (HOSPITAL_COMMUNITY): Payer: Self-pay

## 2021-02-28 MED FILL — Norethindrone Ace-Eth Estradiol-FE Chew Tab 1 MG-20 MCG (24): ORAL | 84 days supply | Qty: 84 | Fill #0 | Status: AC

## 2021-04-26 ENCOUNTER — Other Ambulatory Visit (HOSPITAL_COMMUNITY): Payer: Self-pay

## 2021-04-26 DIAGNOSIS — Z6823 Body mass index (BMI) 23.0-23.9, adult: Secondary | ICD-10-CM | POA: Diagnosis not present

## 2021-04-26 DIAGNOSIS — Z3040 Encounter for surveillance of contraceptives, unspecified: Secondary | ICD-10-CM | POA: Diagnosis not present

## 2021-04-26 DIAGNOSIS — Z01419 Encounter for gynecological examination (general) (routine) without abnormal findings: Secondary | ICD-10-CM | POA: Diagnosis not present

## 2021-04-26 MED ORDER — NORETHIN ACE-ETH ESTRAD-FE 1-20 MG-MCG(24) PO CHEW
CHEWABLE_TABLET | ORAL | 4 refills | Status: DC
  Filled 2021-04-26 – 2021-05-18 (×2): qty 84, 84d supply, fill #0
  Filled 2021-08-17: qty 84, 84d supply, fill #1
  Filled 2021-11-10: qty 84, 84d supply, fill #2
  Filled 2022-02-05: qty 84, 84d supply, fill #3

## 2021-05-06 ENCOUNTER — Other Ambulatory Visit (HOSPITAL_COMMUNITY): Payer: Self-pay

## 2021-05-16 ENCOUNTER — Other Ambulatory Visit (HOSPITAL_COMMUNITY): Payer: Self-pay

## 2021-05-18 ENCOUNTER — Other Ambulatory Visit (HOSPITAL_COMMUNITY): Payer: Self-pay

## 2021-08-17 ENCOUNTER — Other Ambulatory Visit (HOSPITAL_COMMUNITY): Payer: Self-pay

## 2021-11-10 ENCOUNTER — Other Ambulatory Visit (HOSPITAL_COMMUNITY): Payer: Self-pay

## 2022-02-05 ENCOUNTER — Other Ambulatory Visit (HOSPITAL_COMMUNITY): Payer: Self-pay

## 2022-04-27 ENCOUNTER — Other Ambulatory Visit: Payer: Self-pay | Admitting: Radiology

## 2022-04-27 ENCOUNTER — Other Ambulatory Visit (HOSPITAL_COMMUNITY): Payer: Self-pay

## 2022-04-27 DIAGNOSIS — Z3040 Encounter for surveillance of contraceptives, unspecified: Secondary | ICD-10-CM | POA: Diagnosis not present

## 2022-04-27 DIAGNOSIS — Z6824 Body mass index (BMI) 24.0-24.9, adult: Secondary | ICD-10-CM | POA: Diagnosis not present

## 2022-04-27 DIAGNOSIS — Z01419 Encounter for gynecological examination (general) (routine) without abnormal findings: Secondary | ICD-10-CM | POA: Diagnosis not present

## 2022-04-27 MED ORDER — NORETHIN ACE-ETH ESTRAD-FE 1-20 MG-MCG(24) PO CHEW
CHEWABLE_TABLET | ORAL | 4 refills | Status: DC
  Filled 2022-04-27: qty 84, 84d supply, fill #0
  Filled 2022-07-18: qty 84, 84d supply, fill #1
  Filled 2022-10-10: qty 84, 84d supply, fill #2
  Filled 2022-12-30: qty 84, 84d supply, fill #3
  Filled 2023-03-25: qty 84, 84d supply, fill #4

## 2022-04-27 NOTE — Telephone Encounter (Signed)
Is patient transferring care here? If yes I will refill until appt

## 2022-04-27 NOTE — Telephone Encounter (Signed)
Is patient transferring care? PWG patient

## 2022-04-30 ENCOUNTER — Other Ambulatory Visit (HOSPITAL_COMMUNITY): Payer: Self-pay

## 2022-04-30 MED ORDER — CHOLECALCIFEROL 1.25 MG (50000 UT) PO CAPS
50000.0000 [IU] | ORAL_CAPSULE | ORAL | 0 refills | Status: AC
  Filled 2022-04-30: qty 4, 28d supply, fill #0

## 2022-05-17 DIAGNOSIS — Z1231 Encounter for screening mammogram for malignant neoplasm of breast: Secondary | ICD-10-CM | POA: Diagnosis not present

## 2022-06-05 DIAGNOSIS — E559 Vitamin D deficiency, unspecified: Secondary | ICD-10-CM | POA: Diagnosis not present

## 2022-07-18 ENCOUNTER — Other Ambulatory Visit (HOSPITAL_COMMUNITY): Payer: Self-pay

## 2022-10-10 ENCOUNTER — Other Ambulatory Visit (HOSPITAL_COMMUNITY): Payer: Self-pay

## 2023-03-25 ENCOUNTER — Other Ambulatory Visit (HOSPITAL_COMMUNITY): Payer: Self-pay

## 2023-06-03 ENCOUNTER — Other Ambulatory Visit (HOSPITAL_COMMUNITY): Payer: Self-pay

## 2023-06-03 DIAGNOSIS — Z13220 Encounter for screening for lipoid disorders: Secondary | ICD-10-CM | POA: Diagnosis not present

## 2023-06-03 DIAGNOSIS — Z01419 Encounter for gynecological examination (general) (routine) without abnormal findings: Secondary | ICD-10-CM | POA: Diagnosis not present

## 2023-06-03 DIAGNOSIS — Z803 Family history of malignant neoplasm of breast: Secondary | ICD-10-CM | POA: Diagnosis not present

## 2023-06-03 DIAGNOSIS — Z131 Encounter for screening for diabetes mellitus: Secondary | ICD-10-CM | POA: Diagnosis not present

## 2023-06-03 DIAGNOSIS — Z6824 Body mass index (BMI) 24.0-24.9, adult: Secondary | ICD-10-CM | POA: Diagnosis not present

## 2023-06-03 DIAGNOSIS — Z3040 Encounter for surveillance of contraceptives, unspecified: Secondary | ICD-10-CM | POA: Diagnosis not present

## 2023-06-03 DIAGNOSIS — N920 Excessive and frequent menstruation with regular cycle: Secondary | ICD-10-CM | POA: Diagnosis not present

## 2023-06-03 DIAGNOSIS — Z1321 Encounter for screening for nutritional disorder: Secondary | ICD-10-CM | POA: Diagnosis not present

## 2023-06-03 DIAGNOSIS — Z808 Family history of malignant neoplasm of other organs or systems: Secondary | ICD-10-CM | POA: Diagnosis not present

## 2023-06-03 DIAGNOSIS — Z1231 Encounter for screening mammogram for malignant neoplasm of breast: Secondary | ICD-10-CM | POA: Diagnosis not present

## 2023-06-03 MED ORDER — NORETHIN ACE-ETH ESTRAD-FE 1-20 MG-MCG(24) PO CHEW
1.0000 | CHEWABLE_TABLET | Freq: Every day | ORAL | 4 refills | Status: DC
  Filled 2023-06-03 – 2023-06-12 (×2): qty 84, 84d supply, fill #0
  Filled 2023-09-23: qty 84, 84d supply, fill #1
  Filled 2023-12-25: qty 84, 84d supply, fill #2
  Filled 2024-03-15: qty 84, 84d supply, fill #3

## 2023-06-12 ENCOUNTER — Other Ambulatory Visit (HOSPITAL_COMMUNITY): Payer: Self-pay

## 2023-07-16 DIAGNOSIS — N924 Excessive bleeding in the premenopausal period: Secondary | ICD-10-CM | POA: Diagnosis not present

## 2023-07-19 ENCOUNTER — Other Ambulatory Visit (HOSPITAL_COMMUNITY): Payer: Self-pay

## 2023-07-19 MED ORDER — MEDROXYPROGESTERONE ACETATE 10 MG PO TABS
10.0000 mg | ORAL_TABLET | Freq: Every day | ORAL | 0 refills | Status: AC
  Filled 2023-07-19: qty 10, 10d supply, fill #0

## 2023-08-08 DIAGNOSIS — N924 Excessive bleeding in the premenopausal period: Secondary | ICD-10-CM | POA: Diagnosis not present

## 2023-08-27 DIAGNOSIS — R9389 Abnormal findings on diagnostic imaging of other specified body structures: Secondary | ICD-10-CM | POA: Diagnosis not present

## 2023-09-23 ENCOUNTER — Other Ambulatory Visit (HOSPITAL_COMMUNITY): Payer: Self-pay

## 2023-10-04 ENCOUNTER — Other Ambulatory Visit: Payer: Self-pay | Admitting: Obstetrics and Gynecology

## 2023-10-04 DIAGNOSIS — D250 Submucous leiomyoma of uterus: Secondary | ICD-10-CM | POA: Diagnosis not present

## 2023-10-04 DIAGNOSIS — D259 Leiomyoma of uterus, unspecified: Secondary | ICD-10-CM | POA: Diagnosis not present

## 2023-10-04 DIAGNOSIS — N921 Excessive and frequent menstruation with irregular cycle: Secondary | ICD-10-CM | POA: Diagnosis not present

## 2023-10-08 LAB — SURGICAL PATHOLOGY

## 2023-12-25 ENCOUNTER — Other Ambulatory Visit (HOSPITAL_COMMUNITY): Payer: Self-pay

## 2024-06-03 ENCOUNTER — Other Ambulatory Visit: Payer: Self-pay

## 2024-06-03 ENCOUNTER — Other Ambulatory Visit (HOSPITAL_COMMUNITY): Payer: Self-pay

## 2024-06-03 DIAGNOSIS — Z6820 Body mass index (BMI) 20.0-20.9, adult: Secondary | ICD-10-CM | POA: Diagnosis not present

## 2024-06-03 DIAGNOSIS — Z01419 Encounter for gynecological examination (general) (routine) without abnormal findings: Secondary | ICD-10-CM | POA: Diagnosis not present

## 2024-06-03 DIAGNOSIS — Z1231 Encounter for screening mammogram for malignant neoplasm of breast: Secondary | ICD-10-CM | POA: Diagnosis not present

## 2024-06-03 MED ORDER — NORETHIN ACE-ETH ESTRAD-FE 1-20 MG-MCG(24) PO CHEW
1.0000 | CHEWABLE_TABLET | Freq: Every day | ORAL | 4 refills | Status: AC
  Filled 2024-06-03 – 2024-06-24 (×3): qty 84, 84d supply, fill #0
  Filled 2024-09-11: qty 84, 84d supply, fill #1
  Filled 2024-12-07: qty 84, 84d supply, fill #2

## 2024-06-09 ENCOUNTER — Other Ambulatory Visit: Payer: Self-pay | Admitting: Obstetrics and Gynecology

## 2024-06-09 DIAGNOSIS — R928 Other abnormal and inconclusive findings on diagnostic imaging of breast: Secondary | ICD-10-CM

## 2024-06-15 ENCOUNTER — Other Ambulatory Visit (HOSPITAL_COMMUNITY): Payer: Self-pay

## 2024-06-16 ENCOUNTER — Ambulatory Visit
Admission: RE | Admit: 2024-06-16 | Discharge: 2024-06-16 | Disposition: A | Discharge status: Home / Self Care | Payer: Self-pay | Source: Ambulatory Visit | Attending: Obstetrics and Gynecology | Admitting: Obstetrics and Gynecology

## 2024-06-16 ENCOUNTER — Ambulatory Visit: Payer: Self-pay

## 2024-06-16 DIAGNOSIS — R928 Other abnormal and inconclusive findings on diagnostic imaging of breast: Secondary | ICD-10-CM | POA: Diagnosis not present

## 2024-06-24 ENCOUNTER — Other Ambulatory Visit (HOSPITAL_COMMUNITY): Payer: Self-pay
